# Patient Record
Sex: Male | Born: 2000 | Race: White | Hispanic: No | Marital: Single | Smoking: Current every day smoker
Health system: Southern US, Community
[De-identification: ages and names within clinical notes are randomized; demographics above are authoritative.]

## PROBLEM LIST (undated history)

## (undated) DIAGNOSIS — F419 Anxiety disorder, unspecified: Secondary | ICD-10-CM

## (undated) DIAGNOSIS — F909 Attention-deficit hyperactivity disorder, unspecified type: Secondary | ICD-10-CM

## (undated) DIAGNOSIS — F609 Personality disorder, unspecified: Secondary | ICD-10-CM

## (undated) DIAGNOSIS — F4010 Social phobia, unspecified: Secondary | ICD-10-CM

---
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
## 2020-08-14 ENCOUNTER — Ambulatory Visit (INDEPENDENT_AMBULATORY_CARE_PROVIDER_SITE_OTHER): Payer: Medicaid Other

## 2020-08-14 ENCOUNTER — Encounter: Payer: Self-pay | Admitting: Emergency Medicine

## 2020-08-14 ENCOUNTER — Ambulatory Visit
Admission: EM | Admit: 2020-08-14 | Discharge: 2020-08-14 | Disposition: A | Discharge status: Home / Self Care | Payer: Medicaid Other | Attending: Internal Medicine | Admitting: Internal Medicine

## 2020-08-14 ENCOUNTER — Other Ambulatory Visit: Payer: Self-pay

## 2020-08-14 DIAGNOSIS — R042 Hemoptysis: Secondary | ICD-10-CM

## 2020-08-14 DIAGNOSIS — R55 Syncope and collapse: Secondary | ICD-10-CM

## 2020-08-14 DIAGNOSIS — J302 Other seasonal allergic rhinitis: Secondary | ICD-10-CM

## 2020-08-14 DIAGNOSIS — R059 Cough, unspecified: Secondary | ICD-10-CM | POA: Diagnosis not present

## 2020-08-14 LAB — COMPREHENSIVE METABOLIC PANEL
ALT: 17 U/L (ref 0–44)
AST: 20 U/L (ref 15–41)
Albumin: 4.8 g/dL (ref 3.5–5.0)
Alkaline Phosphatase: 91 U/L (ref 38–126)
Anion gap: 9 (ref 5–15)
BUN: 16 mg/dL (ref 6–20)
CO2: 26 mmol/L (ref 22–32)
Calcium: 9.7 mg/dL (ref 8.9–10.3)
Chloride: 103 mmol/L (ref 98–111)
Creatinine, Ser: 0.95 mg/dL (ref 0.61–1.24)
GFR, Estimated: >60 mL/min (ref >60)
Glucose, Bld: 103 mg/dL — ABNORMAL HIGH (ref 70–99)
Potassium: 3.7 mmol/L (ref 3.5–5.1)
Sodium: 138 mmol/L (ref 135–145)
Total Bilirubin: 0.3 mg/dL (ref 0.3–1.2)
Total Protein: 8 g/dL (ref 6.5–8.1)

## 2020-08-14 LAB — CBC WITH DIFFERENTIAL/PLATELET
Abs Immature Granulocytes: 0.03 10*3/uL (ref 0.00–0.07)
Basophils Absolute: 0.1 10*3/uL (ref 0.0–0.1)
Basophils Relative: 1 %
Eosinophils Absolute: 0.1 10*3/uL (ref 0.0–0.5)
Eosinophils Relative: 1 %
HCT: 42.8 % (ref 39.0–52.0)
Hemoglobin: 14.4 g/dL (ref 13.0–17.0)
Immature Granulocytes: 0 %
Lymphocytes Relative: 30 %
Lymphs Abs: 2.9 10*3/uL (ref 0.7–4.0)
MCH: 28.6 pg (ref 26.0–34.0)
MCHC: 33.6 g/dL (ref 30.0–36.0)
MCV: 84.9 fL (ref 80.0–100.0)
Monocytes Absolute: 0.6 10*3/uL (ref 0.1–1.0)
Monocytes Relative: 6 %
Neutro Abs: 5.9 10*3/uL (ref 1.7–7.7)
Neutrophils Relative %: 62 %
Platelets: 281 10*3/uL (ref 150–400)
RBC: 5.04 MIL/uL (ref 4.22–5.81)
RDW: 12.8 % (ref 11.5–15.5)
WBC: 9.6 10*3/uL (ref 4.0–10.5)
nRBC: 0 % (ref 0.0–0.2)

## 2020-08-14 MED ORDER — ALBUTEROL SULFATE HFA 108 (90 BASE) MCG/ACT IN AERS: 1.0000 | INHALATION_SPRAY | Freq: Four times a day (QID) | RESPIRATORY_TRACT | 0 refills | Status: AC | PRN

## 2020-08-14 MED ORDER — CETIRIZINE HCL 10 MG PO TABS: 10.0000 mg | ORAL_TABLET | Freq: Every day | ORAL | 0 refills | Status: AC

## 2020-08-14 NOTE — ED Triage Notes (Signed)
Patient c/o cough, chest congestion and runny nose that started a month ago.  Patient denies fevers.  Patient states that he "passed out" yesterday at work.  Patient states that he did not go get evaluated after that.  Patient states that he hit the back of his head.  Patient reports LOC.  Patient denies any pain at this time.

## 2020-08-14 NOTE — ED Provider Notes (Signed)
[facility]    CSN: [id number] Arrival date & time: 08/14/20  1559      History   Chief Complaint Chief Complaint  Patient presents with  . Cough  . Shortness of Breath  . Fall    HPI Andrew Kemp is a 19 y.o. male comes to urgent care with complaints of cough, chest congestion and runny nose of 1 month duration.  Patient states his symptoms started a month ago and has been persistent.  He denies any itchy nose or eyes.  He has a cough that is nonproductive.  No chest tightness or wheezing.  He cleans workplaces for living.  He has had exposure to dust and fumes from cleaning agents.  He apparently has history of childhood asthma.  He smokes cigarettes.  Cough is minimally productive with occasional streaks of blood on the sputum.  No weight loss.  He complains of pain usually fatigued and has poor sleep quality recently.  Appetite is fair.   Patient had a brief fainting episode at work yesterday.  He was experiencing significant shortness of breath and chest tightness preceding the episode.  No loss of bladder or bowel continence.  No shaking.  Patient promptly recovered from the fainting.  The room they were cleaning was apparently hot.  HPI  Past Medical History:  Diagnosis Date  . ADHD (attention deficit hyperactivity disorder)   . Anxiety   . Personality disorder (HCC)   . Social anxiety disorder 10/21/2015    Patient Active Problem List   Diagnosis Date Noted  . 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 Severe recurrent major depression without psychotic features (HCC) 06/08/2018  . Suicidal ideation 06/08/2018  . Social anxiety disorder 10/21/2015  . 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 MDD (major depressive disorder), recurrent episode, moderate (HCC) 05/12/2014    History reviewed. No pertinent surgical history.     Home  Medications    Prior to Admission medications   Medication Sig Start Date End Date Taking? Authorizing Provider  albuterol (VENTOLIN HFA) 108 (90 Base) MCG/ACT inhaler Inhale 1-2 puffs into the lungs every 6 (six) hours as needed for wheezing or shortness of breath. 08/14/20   Merrilee Jansky, MD  cetirizine (ZYRTEC ALLERGY) 10 MG tablet Take 1 tablet (10 mg total) by mouth daily. 08/14/20   Merrilee Jansky, MD  ARIPiprazole (ABILIFY) 10 MG tablet Take 1 tablet (10 mg total) by mouth daily. 06/15/18 08/14/20  Denzil Magnuson, NP  mirtazapine (REMERON) 15 MG tablet Take 1 tablet (15 mg total) by mouth at bedtime. 06/14/18 08/14/20  Denzil Magnuson, NP  traZODone (DESYREL) 50 MG tablet Take 1 tablet (50 mg total) by mouth at bedtime as needed and may repeat dose one time if needed for sleep. 06/14/18 08/14/20  Denzil Magnuson, NP    Family History Family History  Adopted: Yes    Social History Social History   Tobacco Use  . Smoking status: Current Every Day Smoker    Packs/day: 0.50    Types: Cigarettes  . Smokeless tobacco: Never Used  Vaping Use  . Vaping Use: Never used  Substance Use Topics  . Alcohol use: No  . Drug use: No     Allergies   Patient has no known allergies.   Review of Systems Review of Systems  Constitutional: Negative.   HENT: Positive for congestion. Negative for postnasal drip, rhinorrhea, sinus pressure  and sinus pain.   Respiratory: Positive for cough. Negative for shortness of breath and wheezing.   Gastrointestinal: Negative.  Negative for constipation and diarrhea.     Physical Exam Triage Vital Signs ED Triage Vitals  Enc Vitals Group     BP 08/14/20 1654 99/89     Pulse Rate 08/14/20 1654 61     Resp 08/14/20 1654 16     Temp 08/14/20 1654 98.3 F (36.8 C)     Temp Source 08/14/20 1654 Oral     SpO2 08/14/20 1654 100 %     Weight 08/14/20 1651 173 lb 15.1 oz (78.9 kg)     Height 08/14/20 1651 6' (1.829 m)     Head  Circumference --      Peak Flow --      Pain Score 08/14/20 1651 0     Pain Loc --      Pain Edu? --      Excl. in GC? --    No data found.  Updated Vital Signs BP 99/89 (BP Location: Right Arm)   Pulse 61   Temp 98.3 F (36.8 C) (Oral)   Resp 16   Ht 6' (1.829 m)   Wt 78.9 kg   SpO2 100%   BMI 23.59 kg/m   Visual Acuity Right Eye Distance:   Left Eye Distance:   Bilateral Distance:    Right Eye Near:   Left Eye Near:    Bilateral Near:     Physical Exam   UC Treatments / Results  Labs (all labs ordered are listed, but only abnormal results are displayed) Labs Reviewed  CBC WITH DIFFERENTIAL/PLATELET  COMPREHENSIVE METABOLIC PANEL    EKG   Radiology DG Chest 2 View  Result Date: 08/14/2020 CLINICAL DATA:  Productive cough syncopal episode hemoptysis EXAM: CHEST - 2 VIEW COMPARISON:  None. FINDINGS: The heart size and mediastinal contours are within normal limits. Both lungs are clear. The visualized skeletal structures are unremarkable. IMPRESSION: No active cardiopulmonary disease. Electronically Signed   By: Jasmine Pang M.D.   On: 08/14/2020 17:31    Procedures Procedures (including critical care time)  Medications Ordered in UC Medications - No data to display  Initial Impression / Assessment and Plan / UC Course  I have reviewed the triage vital signs and the nursing notes.  Pertinent labs & imaging results that were available during my care of the patient were reviewed by me and considered in my medical decision making (see chart for details).     1.  Seasonal allergic rhinitis: Zyrtec 10 mg orally daily  2.  Cough-likely bronchospasm from exposure to dust and fumes from cleaning agents: Chest x-ray is negative for acute lung infiltrate Albuterol inhaler as needed If patient experiences any worsening symptoms, he is advised to return to the urgent care to be reevaluated. Final Clinical Impressions(s) / UC Diagnoses   Final diagnoses:   Seasonal allergic rhinitis, unspecified trigger   Discharge Instructions   None    ED Prescriptions    Medication Sig Dispense Auth. Provider   albuterol (VENTOLIN HFA) 108 (90 Base) MCG/ACT inhaler Inhale 1-2 puffs into the lungs every 6 (six) hours as needed for wheezing or shortness of breath. 18 g Merrilee Jansky, MD   cetirizine (ZYRTEC ALLERGY) 10 MG tablet Take 1 tablet (10 mg total) by mouth daily. 90 tablet Nereida Schepp, Britta Mccreedy, MD     PDMP not reviewed this encounter.   Merrilee Jansky, MD 08/14/20 [phone]

## 2022-01-13 IMAGING — CR DG CHEST 2V
3 series · 3 of 3 positions shown · non-contrast
Comparison: None.

CLINICAL DATA: Productive cough syncopal episode hemoptysis

EXAM:
CHEST - 2 VIEW

[chest pa (1 of 2)]
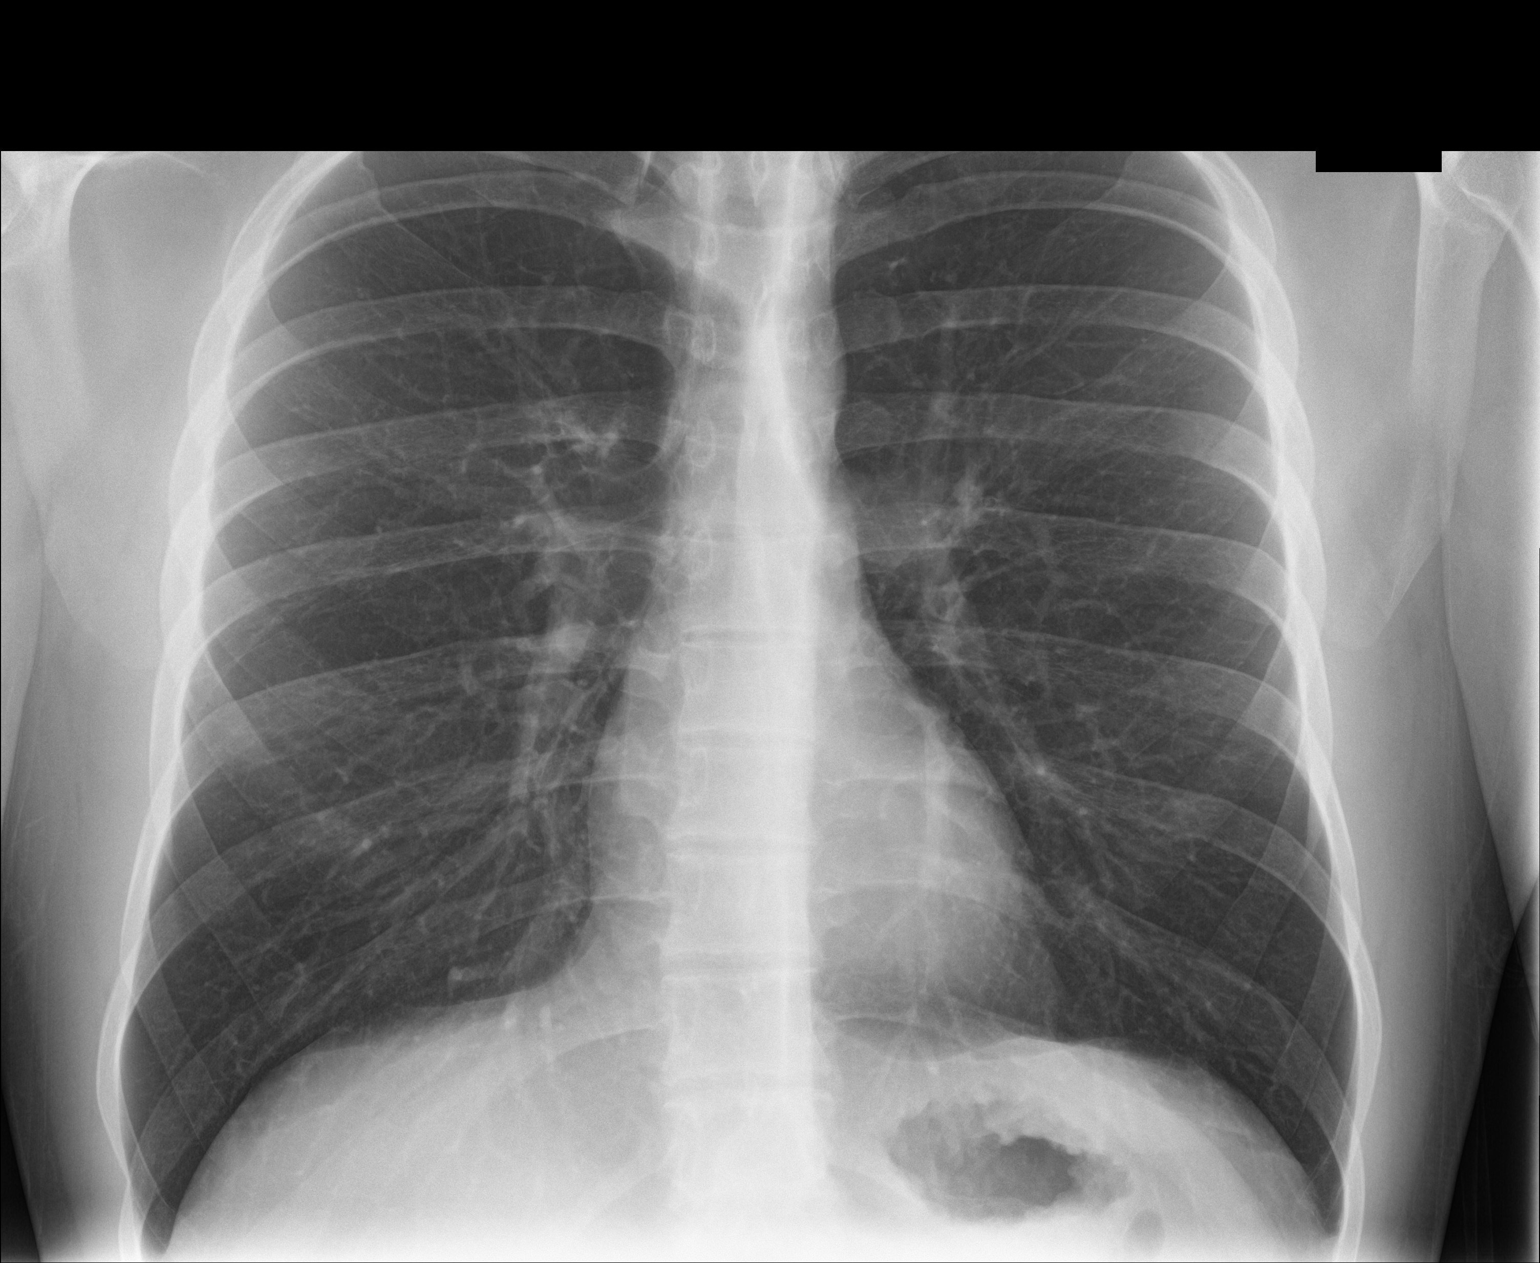

[chest lat]
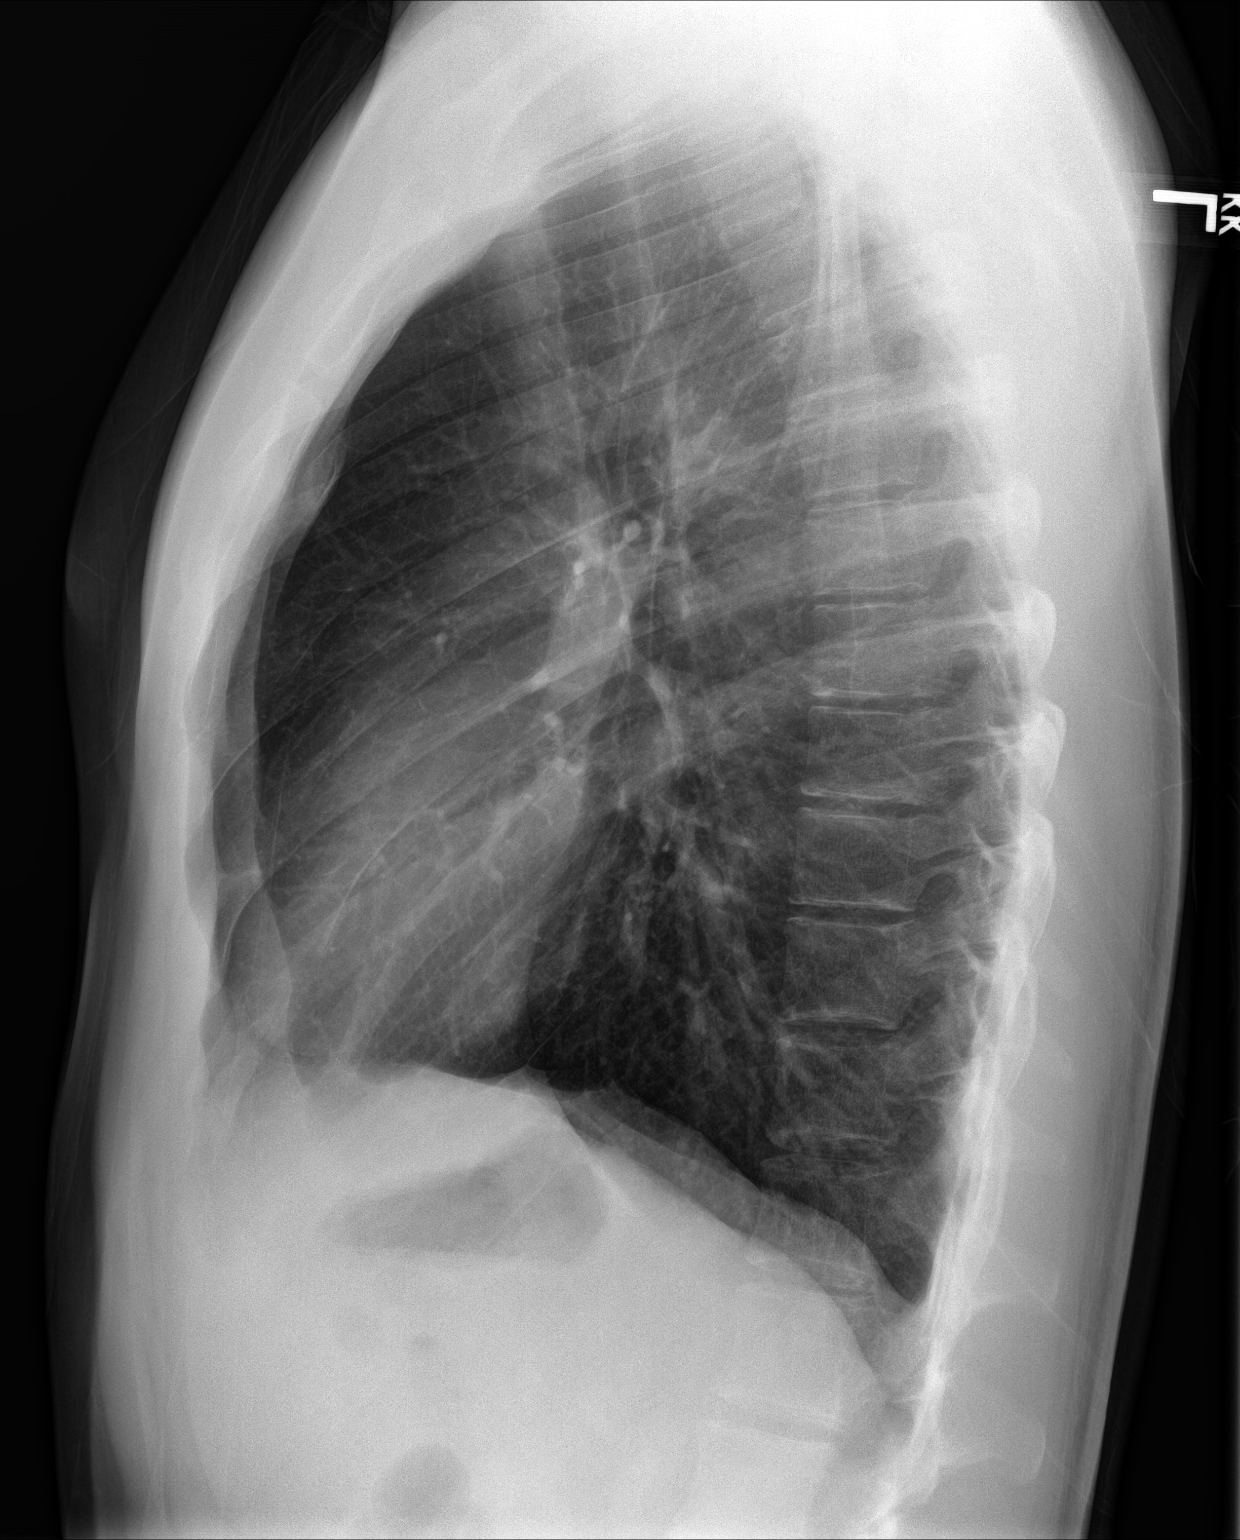

[chest pa (2 of 2)]
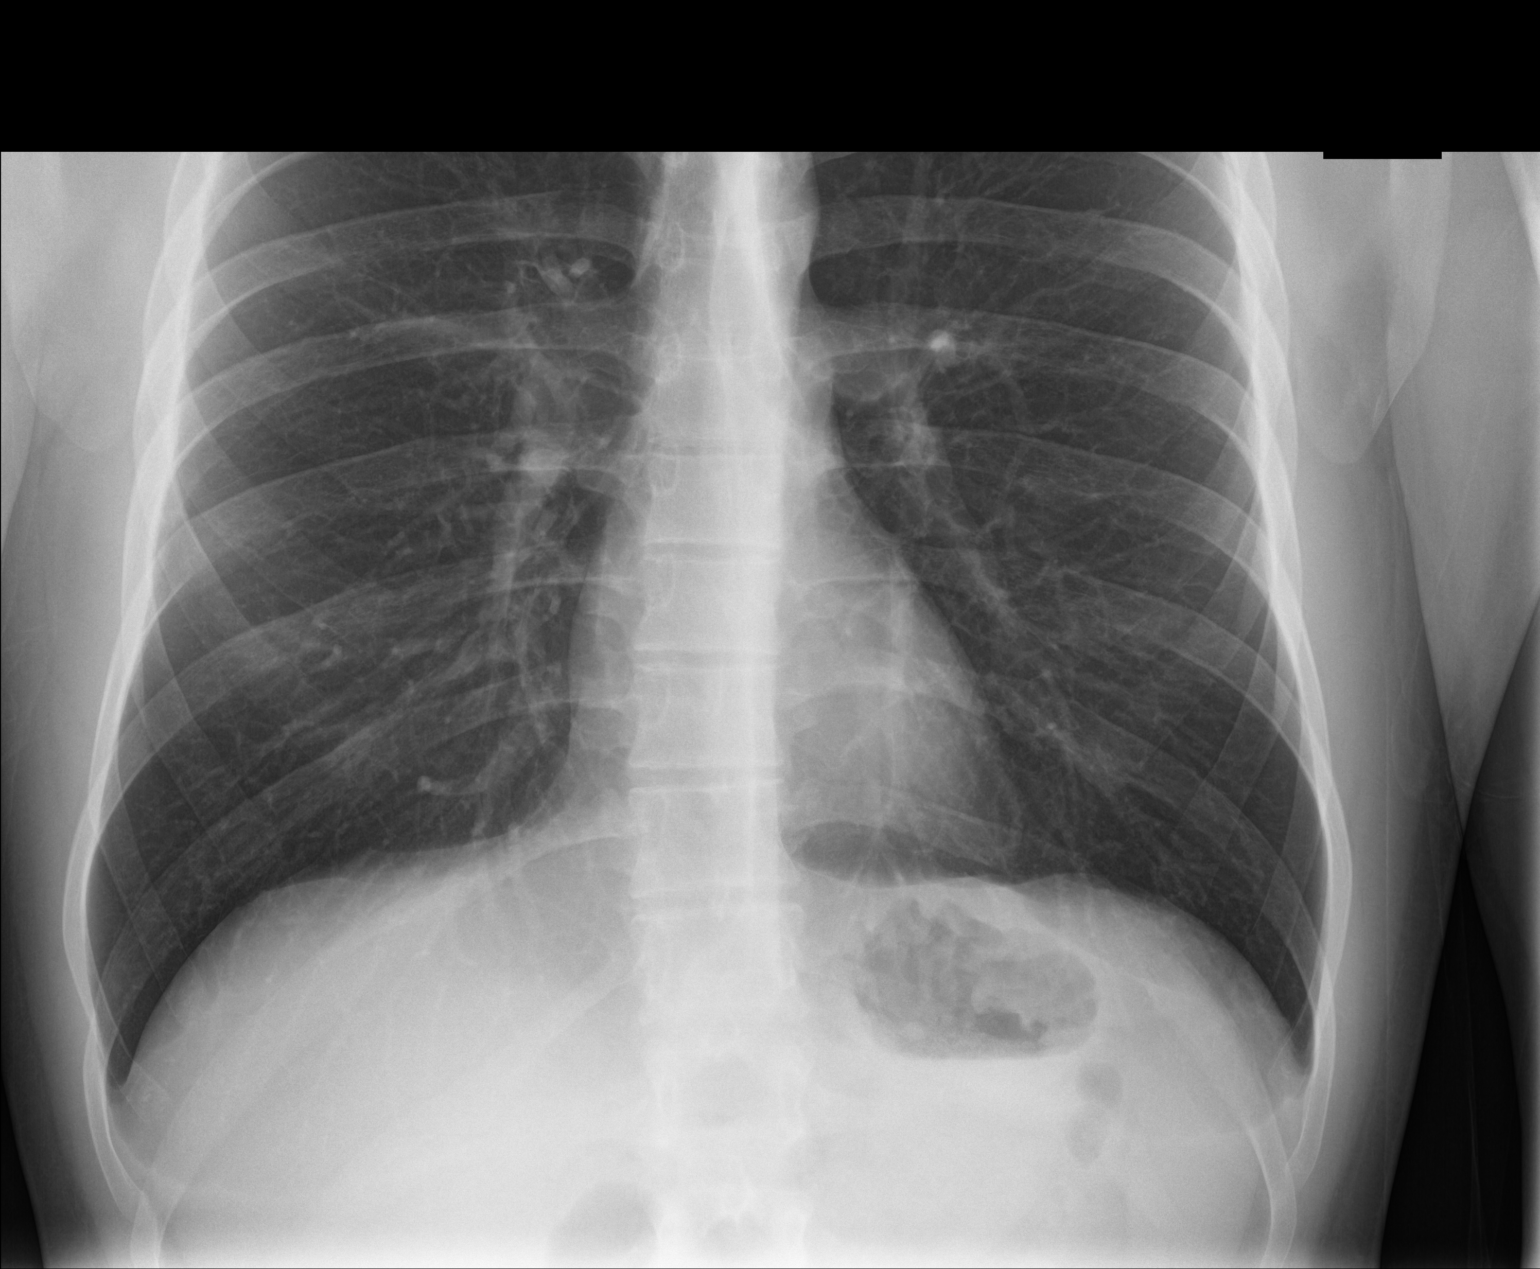

[3 of 3 positions shown; findings below may reference images not displayed]

FINDINGS: The heart size and mediastinal contours are within normal limits.
Both lungs are clear. The visualized skeletal structures are
unremarkable.
IMPRESSION: No active cardiopulmonary disease.

## 2022-05-22 ENCOUNTER — Encounter: Payer: Self-pay | Admitting: Emergency Medicine

## 2022-05-22 ENCOUNTER — Other Ambulatory Visit: Payer: Self-pay

## 2022-05-22 ENCOUNTER — Emergency Department
Admission: EM | Admit: 2022-05-22 | Discharge: 2022-05-22 | Disposition: A | Discharge status: Home / Self Care | Payer: Medicaid Other | Attending: Emergency Medicine | Admitting: Emergency Medicine

## 2022-05-22 ENCOUNTER — Emergency Department: Payer: Medicaid Other

## 2022-05-22 DIAGNOSIS — M79605 Pain in left leg: Secondary | ICD-10-CM

## 2022-05-22 DIAGNOSIS — M79652 Pain in left thigh: Secondary | ICD-10-CM | POA: Insufficient documentation
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
## 2022-05-22 NOTE — Discharge Instructions (Addendum)
You can take Tylenol 1 g every 8 hours and ibuprofen 600 every 6-8 hours with food.  You can follow-up if you need additional time off or things or not getting better with either orthopedics or the [employer] employee health  Your blood pressure was also slightly elevated this should be followed up with a primary care doctor for recheck when you are not in pain  IMPRESSION:  Unremarkable left femur radiographs.
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
## 2023-03-29 ENCOUNTER — Other Ambulatory Visit: Payer: Self-pay

## 2023-03-29 DIAGNOSIS — X58XXXA Exposure to other specified factors, initial encounter: Secondary | ICD-10-CM | POA: Diagnosis not present

## 2023-03-29 DIAGNOSIS — K029 Dental caries, unspecified: Secondary | ICD-10-CM | POA: Insufficient documentation

## 2023-03-29 DIAGNOSIS — S00502A Unspecified superficial injury of oral cavity, initial encounter: Secondary | ICD-10-CM | POA: Diagnosis present

## 2023-03-29 DIAGNOSIS — S025XXA Fracture of tooth (traumatic), initial encounter for closed fracture: Secondary | ICD-10-CM | POA: Diagnosis not present

## 2023-03-29 NOTE — ED Triage Notes (Signed)
Patient ambulatory to triage with steady gait, without difficulty or distress noted; pt reports left sided dental pain since November

## 2023-03-30 ENCOUNTER — Emergency Department
Admission: EM | Admit: 2023-03-30 | Discharge: 2023-03-30 | Disposition: A | Discharge status: Home / Self Care | Payer: Medicaid Other | Attending: Emergency Medicine | Admitting: Emergency Medicine

## 2023-03-30 DIAGNOSIS — K029 Dental caries, unspecified: Secondary | ICD-10-CM

## 2023-03-30 DIAGNOSIS — S025XXA Fracture of tooth (traumatic), initial encounter for closed fracture: Secondary | ICD-10-CM

## 2023-03-30 MED ORDER — HYDROCODONE-ACETAMINOPHEN 5-325 MG PO TABS
1.0000 | ORAL_TABLET | Freq: Once | ORAL | Status: AC
  Administered 2023-03-30: 1 via ORAL
  Filled 2023-03-30: qty 1

## 2023-03-30 MED ORDER — HYDROCODONE-ACETAMINOPHEN 5-325 MG PO TABS: 1.0000 | ORAL_TABLET | Freq: Four times a day (QID) | ORAL | 0 refills | Status: DC | PRN

## 2023-03-30 MED ORDER — AMOXICILLIN 500 MG PO CAPS: 500.0000 mg | ORAL_CAPSULE | Freq: Three times a day (TID) | ORAL | 0 refills | Status: DC

## 2023-03-30 MED ORDER — IBUPROFEN 800 MG PO TABS: 800.0000 mg | ORAL_TABLET | Freq: Three times a day (TID) | ORAL | 0 refills | Status: DC | PRN

## 2023-03-30 MED ORDER — LIDOCAINE VISCOUS HCL 2 % MT SOLN
15.0000 mL | Freq: Once | OROMUCOSAL | Status: AC
  Administered 2023-03-30: 15 mL via OROMUCOSAL
  Filled 2023-03-30: qty 15

## 2023-03-30 MED ORDER — IBUPROFEN 800 MG PO TABS
800.0000 mg | ORAL_TABLET | Freq: Once | ORAL | Status: AC
  Administered 2023-03-30: 800 mg via ORAL
  Filled 2023-03-30: qty 1

## 2023-03-30 MED ORDER — AMOXICILLIN 500 MG PO CAPS
500.0000 mg | ORAL_CAPSULE | Freq: Once | ORAL | Status: AC
  Administered 2023-03-30: 500 mg via ORAL
  Filled 2023-03-30: qty 1

## 2023-03-30 NOTE — Discharge Instructions (Signed)
1. Take antibiotic as prescribed (Amoxicillin 3 times daily x 7 days). 2. Take pain medicines as needed (Motrin/Norco #15). 3. Return to the ER for worsening symptoms, persistent vomiting, fever, difficulty breathing or other concerns.  OPTIONS FOR DENTAL FOLLOW UP CARE  [place] [facility] - Local Safety Net Dental Clinics [email or web address]   [facility] [phone])  [facility] [phone])  [place] [phone] ext [phone])  [facility] [phone])  [facility] [phone]) This clinic caters to the indigent population and is on a lottery system. Location: [employer] of Dentistry, [employer], [address] [place] Clinic Hours: Wednesdays from 6pm - 9pm, patients seen by a lottery system. For dates, call or go to [email or web address] Services: Cleanings, fillings and simple extractions. Payment Options: DENTAL WORK IS FREE OF CHARGE. Bring proof of income or support. Best way to get seen: Arrive at 5:15 pm - this is a lottery, NOT first come/first serve, so arriving earlier will not increase your chances of being seen.     [facility] [phone] Select option 1 for emergencies   Location: [facility] of Dentistry, [place], [address], [place] Clinic Hours: No walk-ins accepted - call the day before to schedule an appointment. Check in times are 9:30 am and 1:30 pm. Services: Simple extractions, temporary fillings, pulpectomy/pulp debridement, uncomplicated abscess drainage. Payment Options: PAYMENT IS DUE AT THE TIME OF SERVICE.  Fee is usually $100-200, additional surgical procedures (e.g. abscess drainage) may be extra. Cash, checks, Visa/MasterCard accepted.  Can file Medicaid if patient is covered for dental - patient should call case worker to  check. No discount for [facility] patients. Best way to get seen: MUST call the day before and get onto the schedule. Can usually be seen the next 1-2 days. No walk-ins accepted.     [facility] [phone]   Location: [facility], [address], [place] Clinic Hours: M, W, Th, F 8am or 1:30pm, Tues 9a or 1:30 - first come/first served. Services: Simple extractions, temporary fillings, uncomplicated abscess drainage.  You do not need to be an [facility] resident. Payment Options: PAYMENT IS DUE AT THE TIME OF SERVICE. Dental insurance, otherwise sliding scale - bring proof of income or support. Depending on income and treatment needed, cost is usually $50-200. Best way to get seen: Arrive early as it is first come/first served.     [facility] [phone]   Location: [address] Clinic Hours: Mon-Thu 8a-5p Services: Most basic dental services including extractions and fillings. Payment Options: PAYMENT IS DUE AT THE TIME OF SERVICE. Sliding scale, up to 50% off - bring proof if income or support. Medicaid with dental option accepted. Best way to get seen: Call to schedule an appointment, can usually be seen within 2 weeks OR they will try to see walk-ins - show up at 8a or 2p (you may have to wait).     [facility] [phone] [place] RESIDENTS ONLY   Location: [facility], [address], [place], [place] [address] Clinic Hours: By appointment only. Monday - Thursday 8am-5pm, Friday 8am-12pm Services: Cleanings, fillings, extractions. Payment Options: PAYMENT IS DUE AT THE TIME OF SERVICE. Cash, Visa or MasterCard. Sliding scale - $30 minimum per service. Best way to get seen: Come in to office, complete packet and make an appointment - need proof of income  or support monies for each household member and proof of [facility]  residence. Usually takes about a month to get in.     [facility] [phone]   Location: [address] [facility] Clinic Hours: Walk-in Urgent Care Dental Services are offered Monday-Friday mornings only. The numbers of emergencies accepted daily is limited to the number of providers available. Maximum 15 - Mondays, Wednesdays & Thursdays Maximum 10 - Tuesdays & Fridays Services: You do not need to be a [facility] resident to be seen for a dental emergency. Emergencies are defined as pain, swelling, abnormal bleeding, or dental trauma. Walkins will receive x-rays if needed. NOTE: Dental cleaning is not an emergency. Payment Options: PAYMENT IS DUE AT THE TIME OF SERVICE. Minimum co-pay is $40.00 for uninsured patients. Minimum co-pay is $3.00 for Medicaid with dental coverage. Dental Insurance is accepted and must be presented at time of visit. Medicare does not cover dental. Forms of payment: Cash, credit card, checks. Best way to get seen: If not previously registered with the clinic, walk-in dental registration begins at 7:15 am and is on a first come/first serve basis. If previously registered with the clinic, call to make an appointment.     [facility] [phone] [place] RESIDENTS ONLY   Location: [address], [place], [place] Clinic Hours: Mon-Thu 10a-2p Services: Extractions only! Payment Options: FREE (donations accepted) - bring proof of income or support Best way to get seen: Call and schedule an appointment OR come at 8am on the 1st Monday of every month (except for holidays) when it is first come/first served.     [facility] [phone]   Location: [address] [place], [place] Clinic Hours: Friday mornings Services, Payment Options, Best way to get seen: Call for info

## 2023-03-30 NOTE — ED Provider Notes (Signed)
[facility] Provider Note    Event Date/Time   First MD Initiated Contact with Patient 03/30/23 0050     (approximate)   History   Dental Pain   HPI  Andrew Kemp is a 22 y.o. male who presents to the ED from home with a chief complaint of dental pain.  Reports left lower molar pain off and on since November, increased over the past day.  Taking OTC medications without relief of symptoms.  Denies fever/chills, facial swelling, nausea/vomiting or dizziness.     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   Home Medications   Prior to Admission medications   Medication Sig Start Date End Date Taking? Authorizing Provider  albuterol (VENTOLIN HFA) 108 (90 Base) MCG/ACT inhaler Inhale 1-2 puffs into the lungs every 6 (six) hours as needed for wheezing or shortness of breath. 08/14/20   Merrilee Jansky, MD  cetirizine (ZYRTEC ALLERGY) 10 MG tablet Take 1 tablet (10 mg total) by mouth daily. 08/14/20   Merrilee Jansky, MD  ARIPiprazole (ABILIFY) 10 MG tablet Take 1 tablet (10 mg total) by mouth daily. 06/15/18 08/14/20  Denzil Magnuson, NP  mirtazapine (REMERON) 15 MG tablet Take 1 tablet (15 mg total) by mouth at bedtime. 06/14/18  08/14/20  Denzil Magnuson, NP  traZODone (DESYREL) 50 MG tablet Take 1 tablet (50 mg total) by mouth at bedtime as needed and may repeat dose one time if needed for sleep. 06/14/18 08/14/20  Denzil Magnuson, NP     Allergies  Patient has no known allergies.   Family History   Family History  Adopted: Yes     Physical Exam  Triage Vital Signs: ED Triage Vitals  Enc Vitals Group     BP 03/29/23 2334 138/89     Pulse Rate 03/29/23 2334 90     Resp 03/29/23 2334 18     Temp 03/29/23 2334 (!) 97 F (36.1 C)     Temp Source 03/29/23 2334 Oral     SpO2 03/29/23 2334 97 %     Weight 03/29/23 2328 200 lb (90.7 kg)     Height 03/29/23 2328 6\' 2"  (1.88 m)     Head Circumference --      Peak Flow --      Pain Score 03/29/23 2328 10     Pain Loc --      Pain Edu? --      Excl. in GC? --     Updated Vital Signs: BP 138/89 (BP Location: Left Arm)   Pulse 90   Temp (!) 97 F (36.1 C) (Oral)   Resp 18   Ht 6\' 2"  (1.88 m)   Wt 90.7 kg   SpO2 97%   BMI 25.68 kg/m    General:  Awake, no distress. CV:  Good peripheral perfusion.  Resp:  Normal effort.  Abd:  No distention.  Other:  Both posterior molars with decay and cracked/missing enamel.  Left one slightly bloody.  No intra or extraoral swelling.  ED Results / Procedures / Treatments  Labs (all labs ordered are listed, but only abnormal results are displayed) Labs Reviewed - No data to display   EKG  None   RADIOLOGY None   Official radiology report(s): No results found.   PROCEDURES:  Critical Care performed: No  Procedures   MEDICATIONS ORDERED IN ED: Medications  amoxicillin (AMOXIL) capsule 500 mg (has no administration in time range)  lidocaine (XYLOCAINE) 2 % viscous mouth solution 15 mL (has no administration in time range)  ibuprofen (ADVIL) tablet 800 mg (has no administration in time range)  HYDROcodone-acetaminophen (NORCO/VICODIN) 5-325 MG per tablet 1 tablet (has no administration in  time range)     IMPRESSION / MDM / ASSESSMENT AND PLAN / ED COURSE  I reviewed the triage vital signs and the nursing notes.                             22 year old male presenting with dentalgia.  Will start amoxicillin, lidocaine rinse, NSAIDs/analgesia and refer to dental clinic for follow-up.  Strict return precautions given.  Patient and family member verbalized understanding agree with plan of care.  Patient's presentation is most consistent with acute, uncomplicated illness.  FINAL CLINICAL IMPRESSION(S) / ED DIAGNOSES   Final diagnoses:  Pain due to dental caries  Closed fracture of tooth, initial encounter     Rx / DC Orders   ED Discharge Orders     None        Note:  This document was prepared using Dragon voice recognition software and may include unintentional dictation errors.   Irean Hong, MD 03/30/23 [phone]

## 2024-09-04 ENCOUNTER — Emergency Department: Payer: MEDICAID

## 2024-09-04 ENCOUNTER — Other Ambulatory Visit: Payer: Self-pay

## 2024-09-04 ENCOUNTER — Emergency Department
Admission: EM | Admit: 2024-09-04 | Discharge: 2024-09-04 | Disposition: A | Discharge status: Home / Self Care | Payer: MEDICAID | Attending: Emergency Medicine | Admitting: Emergency Medicine

## 2024-09-04 DIAGNOSIS — R739 Hyperglycemia, unspecified: Secondary | ICD-10-CM | POA: Diagnosis not present

## 2024-09-04 DIAGNOSIS — R079 Chest pain, unspecified: Secondary | ICD-10-CM

## 2024-09-04 DIAGNOSIS — F419 Anxiety disorder, unspecified: Secondary | ICD-10-CM | POA: Diagnosis present

## 2024-09-04 LAB — CBC
HCT: 39.2 % (ref 39.0–52.0)
Hemoglobin: 13.4 g/dL (ref 13.0–17.0)
MCH: 28.9 pg (ref 26.0–34.0)
MCHC: 34.2 g/dL (ref 30.0–36.0)
MCV: 84.7 fL (ref 80.0–100.0)
Platelets: 271 K/uL (ref 150–400)
RBC: 4.63 MIL/uL (ref 4.22–5.81)
RDW: 13 % (ref 11.5–15.5)
WBC: 7.4 K/uL (ref 4.0–10.5)
nRBC: 0 % (ref 0.0–0.2)

## 2024-09-04 LAB — BASIC METABOLIC PANEL WITH GFR
Anion gap: 16 — ABNORMAL HIGH (ref 5–15)
BUN: 18 mg/dL (ref 6–20)
CO2: 23 mmol/L (ref 22–32)
Calcium: 9.7 mg/dL (ref 8.9–10.3)
Chloride: 101 mmol/L (ref 98–111)
Creatinine, Ser: 0.96 mg/dL (ref 0.61–1.24)
GFR, Estimated: >60 mL/min (ref >60)
Glucose, Bld: 162 mg/dL — ABNORMAL HIGH (ref 70–99)
Potassium: 3.6 mmol/L (ref 3.5–5.1)
Sodium: 139 mmol/L (ref 135–145)

## 2024-09-04 LAB — TROPONIN T, HIGH SENSITIVITY: Troponin T High Sensitivity: <15 ng/L (ref 0–19)

## 2024-09-04 MED ORDER — LORAZEPAM 0.5 MG PO TABS
0.5000 mg | ORAL_TABLET | Freq: Once | ORAL | Status: AC
  Administered 2024-09-04: 0.5 mg via ORAL
  Filled 2024-09-04: qty 1

## 2024-09-04 NOTE — ED Provider Notes (Signed)
 [facility] Provider Note    Event Date/Time   First MD Initiated Contact with Patient 09/04/24 1710     (approximate)   History   Shaking and Chest Pain   HPI  Andrew Kemp is a 23 y.o. male past medical history significant for anxiety who presents to the emergency department for chest pain and shaking.  Patient states that he has been feeling very anxious and having some chest pain.  States that he has a history of anxiety.  Does endorse vaping THC.  Denies any history of DVT or PE.  No shortness of breath.  No pleuritic chest pain.  No cough or pain that is worse with change in position.  No pain that is worse with movement or positional.  No abdominal pain, nausea or vomiting.     Physical Exam   Triage Vital Signs: ED Triage Vitals  Encounter Vitals Group     BP 09/04/24 1521 (!) 137/95     Girls Systolic BP Percentile --      Girls Diastolic BP Percentile --      Boys Systolic BP Percentile --      Boys Diastolic BP Percentile --      Pulse Rate 09/04/24 1521 (!) 102     Resp 09/04/24 1521 19     Temp 09/04/24 1521 98.6 F (37 C)     Temp Source 09/04/24 1521 Oral     SpO2 09/04/24 1521 100 %     Weight 09/04/24 1522 200 lb (90.7 kg)     Height 09/04/24 1522 6' 2 (1.88 m)     Head Circumference --      Peak Flow --      Pain Score 09/04/24 1521 4     Pain Loc --      Pain Education --      Exclude from Growth Chart --     Most recent vital signs: Vitals:   09/04/24 1521 09/04/24 1725  BP: (!) 137/95   Pulse: (!) 102   Resp: 19   Temp: 98.6 F (37 C)   SpO2: 100% 100%    Physical Exam Constitutional:      Appearance: He is well-developed.  HENT:     Head: Atraumatic.  Eyes:     Conjunctiva/sclera: Conjunctivae normal.  Cardiovascular:     Rate and Rhythm: Regular rhythm.  Pulmonary:     Effort: No respiratory distress.  Abdominal:     Palpations: Abdomen is soft.  Musculoskeletal:     Cervical back: Normal range of  motion.     Right lower leg: No edema.     Left lower leg: No edema.  Skin:    General: Skin is warm.     Capillary Refill: Capillary refill takes less than 2 seconds.  Neurological:     Mental Status: He is alert. Mental status is at baseline.     IMPRESSION / MDM / ASSESSMENT AND PLAN / ED COURSE  I reviewed the triage vital signs and the nursing notes.  Differential diagnosis including stress/anxiety, pericarditis, pneumonia, ACS, anemia  EKG  I, Clotilda Punter, the attending physician, personally viewed and interpreted this ECG.  EKG consistent with benign early repolarization.  Incomplete bundle branch block.  Short PR but no findings consistent with WPW.  Normal QTc.  Narrow complex.  No prior EKG to compare.   RADIOLOGY Chest x-ray no acute findings LABS (all labs ordered are listed, but only abnormal results are  displayed) Labs interpreted as -    Labs Reviewed  BASIC METABOLIC PANEL WITH GFR - Abnormal; Notable for the following components:      Result Value   Glucose, Bld 162 (*)    Anion gap 16 (*)    All other components within normal limits  CBC  TROPONIN T, HIGH SENSITIVITY     MDM  Clinical picture is most consistent with anxiety.  Patient given p.o. Ativan in the emergency department.  No SI or HI.  Low suspicion for ACS, troponin is undetectable, low risk heart score no active chest pain at this time.  Low suspicion for pulmonary embolism, low risk Wells criteria, no shortness of breath, mildly tachycardic on arrival but otherwise a very low suspicion for pulmonary embolism and would be PERC negative.  No pleuritic nature of the pain.  Does have some diffuse ST elevation on his EKG but no positional changes and have a low suspicion for acute pericarditis.  No tearing chest pain and have low suspicion for dissection.  Lab work does have mild elevation of his glucose with an anion gap of 16 however has a normal CO2 of 23.  No history of diabetes and does  not take any daily medications.  No leukocytosis or anemia.  Have a low suspicion for an infectious process.  Most likely secondary to stress and anxiety.  Given information to follow-up as an outpatient with [facility] and given a referral for primary care.  Discussed return precautions for any ongoing worsening symptoms.  Encouraged on [facility] cessation given that this is likely adding to his anxiety.     PROCEDURES:  Critical Care performed: No  Procedures  Patient's presentation is most consistent with acute presentation with potential threat to life or bodily function.   MEDICATIONS ORDERED IN ED: Medications  LORazepam (ATIVAN) tablet 0.5 mg (0.5 mg Oral Given 09/04/24 1735)    FINAL CLINICAL IMPRESSION(S) / ED DIAGNOSES   Final diagnoses:  Anxiety  Chest pain, unspecified type     Rx / DC Orders   ED Discharge Orders          Ordered    Ambulatory Referral to Primary Care (Establish Care)        09/04/24 1731             Note:  This document was prepared using Dragon voice recognition software and may include unintentional dictation errors.   Suzanne Kirsch, MD 09/04/24 1818

## 2024-09-04 NOTE — ED Triage Notes (Addendum)
 Pt sts that he has been having chest pain and shaking. Pt has a previous dx with social anxiety and thinks that may be it as he is having a lot of stress in him life. Pt sts that his s/s start when he is around ppl.

## 2024-10-18 ENCOUNTER — Other Ambulatory Visit: Payer: Self-pay

## 2024-10-18 ENCOUNTER — Emergency Department
Admission: EM | Admit: 2024-10-18 | Discharge: 2024-10-18 | Disposition: A | Discharge status: Home / Self Care | Payer: MEDICAID | Attending: Emergency Medicine | Admitting: Emergency Medicine

## 2024-10-18 ENCOUNTER — Encounter: Payer: Self-pay | Admitting: Emergency Medicine

## 2024-10-18 DIAGNOSIS — J101 Influenza due to other identified influenza virus with other respiratory manifestations: Secondary | ICD-10-CM | POA: Insufficient documentation

## 2024-10-18 DIAGNOSIS — J069 Acute upper respiratory infection, unspecified: Secondary | ICD-10-CM

## 2024-10-18 DIAGNOSIS — R6889 Other general symptoms and signs: Secondary | ICD-10-CM

## 2024-10-18 DIAGNOSIS — R059 Cough, unspecified: Secondary | ICD-10-CM | POA: Diagnosis present

## 2024-10-18 LAB — RESP PANEL BY RT-PCR (RSV, FLU A&B, COVID)  RVPGX2
Influenza A by PCR: POSITIVE — AB
Influenza B by PCR: NEGATIVE
Resp Syncytial Virus by PCR: NEGATIVE
SARS Coronavirus 2 by RT PCR: NEGATIVE

## 2024-10-18 MED ORDER — ONDANSETRON 8 MG PO TBDP: 8.0000 mg | ORAL_TABLET | Freq: Three times a day (TID) | ORAL | 0 refills | Status: AC | PRN

## 2024-10-18 NOTE — ED Provider Notes (Signed)
 "  [facility] Provider Note   Event Date/Time   First MD Initiated Contact with Patient 10/18/24 [phone]     (approximate) History  Cough  HPI Khaleef C Bartolomei is a 24 y.o. male with a past medical history of ADHD who presents complaining of cough, body aches, decreased appetite, nasal congestion, and sore throat over the past 24 hours.  Patient states he has sick contacts at work.  Patient denies any of the sick contacts being diagnosed with flu, COVID, or RSV.  Patient denies any history of asthma or breathing issues.  Patient denies any recent travel, sick contacts, or food at the ordinary.  Patient denies any immunocompromising diagnoses.  Patient does endorse mild associated shortness of breath ROS: Patient currently denies any vision changes, tinnitus, difficulty speaking, facial droop, chest pain, abdominal pain, nausea/vomiting/diarrhea, dysuria, or weakness/numbness/paresthesias in any extremity   Physical Exam  Triage Vital Signs: ED Triage Vitals [10/18/24 0838]  Encounter Vitals Group     BP 139/79     Girls Systolic BP Percentile      Girls Diastolic BP Percentile      Boys Systolic BP Percentile      Boys Diastolic BP Percentile      Pulse Rate 98     Resp 18     Temp 99.4 F (37.4 C)     Temp src      SpO2 96 %     Weight 200 lb (90.7 kg)     Height 6' 2 (1.88 m)     Head Circumference      Peak Flow      Pain Score      Pain Loc      Pain Education      Exclude from Growth Chart    Most recent vital signs: Vitals:   10/18/24 0838  BP: 139/79  Pulse: 98  Resp: 18  Temp: 99.4 F (37.4 C)  SpO2: 96%   General: Awake, oriented x4. CV:  Good peripheral perfusion. Resp:  Normal effort. Abd:  No distention. Other:  Young adult well-developed, well-nourished resting comfortably in no acute distress.  Conjunctival injection bilaterally, erythematous posterior oropharynx. ED Results / Procedures / Treatments  Labs (all labs ordered are listed,  but only abnormal results are displayed) Labs Reviewed  RESP PANEL BY RT-PCR (RSV, FLU A&B, COVID)  [id number]   PROCEDURES: Critical Care performed: No Procedures MEDICATIONS ORDERED IN ED: Medications - No data to display IMPRESSION / MDM / ASSESSMENT AND PLAN / ED COURSE  I reviewed the triage vital signs and the nursing notes.                             The patient is on the cardiac monitor to evaluate for evidence of arrhythmia and/or significant heart rate changes. Patient's presentation is most consistent with acute presentation with potential threat to life or bodily function. Patient is a 24 year old male with the above-stated past medical history who presents for signs and symptoms concerning for viral upper respiratory infection.  Patient states that he needs a diagnosis for work and therefore requests a nasal swab at this time.  Patient swabbed prior to discharge.  Patient does not meet inpatient criteria or red flag symptomatology at this time.  Patient is stable for discharge with outpatient follow-up as needed.  Dispo: Discharge home with PCP follow-up   FINAL CLINICAL IMPRESSION(S) / ED DIAGNOSES   Final  diagnoses:  Acute upper respiratory infection  Flu-like symptoms   Rx / DC Orders   ED Discharge Orders          Ordered    ondansetron (ZOFRAN-ODT) 8 MG disintegrating tablet  Every 8 hours PRN        10/18/24 0901           Note:  This document was prepared using Dragon voice recognition software and may include unintentional dictation errors.   Rio Kidane K, MD 10/18/24 [phone]  "

## 2024-10-18 NOTE — ED Triage Notes (Signed)
 C/o cough, body aches, lost of appetite, congestion x1 day. GCS 15, ambulatory in triage

## 2024-10-18 NOTE — ED Notes (Signed)
 See triage note  Presents with low grade temp,cough and congestion States sx's started yetserday
